# Patient Record
Sex: Male | Born: 1960 | Race: White | Hispanic: No | Marital: Single | Smoking: Never smoker
Health system: Southern US, Community
[De-identification: ages and names within clinical notes are randomized; demographics above are authoritative.]

## PROBLEM LIST (undated history)

## (undated) DIAGNOSIS — R32 Unspecified urinary incontinence: Secondary | ICD-10-CM

## (undated) DIAGNOSIS — I10 Essential (primary) hypertension: Secondary | ICD-10-CM

## (undated) DIAGNOSIS — R413 Other amnesia: Secondary | ICD-10-CM

## (undated) DIAGNOSIS — F419 Anxiety disorder, unspecified: Secondary | ICD-10-CM

## (undated) HISTORY — PX: TRANSTHORACIC ECHOCARDIOGRAM: SHX275

## (undated) HISTORY — PX: NO PAST SURGERIES: SHX2092

---

## 2015-04-20 ENCOUNTER — Other Ambulatory Visit (HOSPITAL_COMMUNITY): Payer: Self-pay | Admitting: General Practice

## 2015-04-20 DIAGNOSIS — R413 Other amnesia: Secondary | ICD-10-CM

## 2015-04-28 ENCOUNTER — Inpatient Hospital Stay (HOSPITAL_COMMUNITY)
Admission: RE | Admit: 2015-04-28 | Discharge: 2015-04-28 | Disposition: A | Discharge status: Home / Self Care | Payer: Self-pay | Source: Ambulatory Visit

## 2015-05-04 ENCOUNTER — Encounter (HOSPITAL_COMMUNITY): Payer: Self-pay | Admitting: *Deleted

## 2015-05-04 NOTE — Progress Notes (Signed)
LVM with Gladstone LighterAlecia, Radiology Scheduler, to make aware that pt is unable to arrive at 5:30 AM and is requesting a later appointment if available; waiting for a return call.

## 2015-05-04 NOTE — Progress Notes (Signed)
[facility] and she stated that pt was cancelled because they did not show for scheduled PAT. Marleta also stated that  Quinn PlowmanAmy Kearns, PA-C, needs to clarify order for consent,  "  with or without."

## 2015-05-05 ENCOUNTER — Ambulatory Visit (HOSPITAL_COMMUNITY): Admission: RE | Admit: 2015-05-05 | Payer: Medicaid Other | Source: Ambulatory Visit

## 2015-05-06 DIAGNOSIS — Z01818 Encounter for other preprocedural examination: Secondary | ICD-10-CM | POA: Diagnosis not present

## 2015-05-06 DIAGNOSIS — I10 Essential (primary) hypertension: Secondary | ICD-10-CM | POA: Diagnosis not present

## 2015-05-06 DIAGNOSIS — Z01812 Encounter for preprocedural laboratory examination: Secondary | ICD-10-CM | POA: Diagnosis not present

## 2015-05-06 DIAGNOSIS — R413 Other amnesia: Secondary | ICD-10-CM | POA: Diagnosis not present

## 2015-05-09 ENCOUNTER — Other Ambulatory Visit: Payer: Self-pay

## 2015-05-09 ENCOUNTER — Encounter (HOSPITAL_COMMUNITY)
Admission: RE | Admit: 2015-05-09 | Discharge: 2015-05-09 | Disposition: A | Discharge status: Home / Self Care | Payer: Medicaid Other | Source: Ambulatory Visit | Attending: General Practice | Admitting: General Practice

## 2015-05-09 ENCOUNTER — Encounter (HOSPITAL_COMMUNITY): Payer: Self-pay

## 2015-05-09 DIAGNOSIS — I10 Essential (primary) hypertension: Secondary | ICD-10-CM | POA: Diagnosis not present

## 2015-05-09 DIAGNOSIS — Z01818 Encounter for other preprocedural examination: Secondary | ICD-10-CM | POA: Diagnosis not present

## 2015-05-09 DIAGNOSIS — R413 Other amnesia: Secondary | ICD-10-CM | POA: Insufficient documentation

## 2015-05-09 DIAGNOSIS — Z01812 Encounter for preprocedural laboratory examination: Secondary | ICD-10-CM | POA: Diagnosis not present

## 2015-05-09 HISTORY — DX: Anxiety disorder, unspecified: F41.9

## 2015-05-09 HISTORY — DX: Essential (primary) hypertension: I10

## 2015-05-09 LAB — CBC
HCT: 36.9 % — ABNORMAL LOW (ref 39.0–52.0)
Hemoglobin: 13.1 g/dL (ref 13.0–17.0)
MCH: 33.8 pg (ref 26.0–34.0)
MCHC: 35.5 g/dL (ref 30.0–36.0)
MCV: 95.1 fL (ref 78.0–100.0)
PLATELETS: 138 10*3/uL — AB (ref 150–400)
RBC: 3.88 MIL/uL — ABNORMAL LOW (ref 4.22–5.81)
RDW: 12.4 % (ref 11.5–15.5)
WBC: 6.1 10*3/uL (ref 4.0–10.5)

## 2015-05-09 LAB — BASIC METABOLIC PANEL
Anion gap: 10 (ref 5–15)
BUN: 16 mg/dL (ref 6–20)
CALCIUM: 9.4 mg/dL (ref 8.9–10.3)
CO2: 27 mmol/L (ref 22–32)
CREATININE: 1.02 mg/dL (ref 0.61–1.24)
Chloride: 104 mmol/L (ref 101–111)
Glucose, Bld: 91 mg/dL (ref 65–99)
Potassium: 3.9 mmol/L (ref 3.5–5.1)
SODIUM: 141 mmol/L (ref 135–145)

## 2015-05-09 NOTE — Pre-Procedure Instructions (Signed)
Dylan CanaryRichard D Servantes  05/09/2015      [facility] [address] [place], [place] - [address] [place] [id number] Phone: [phone] Fax: [phone]    Your procedure is scheduled on     Tuesday   05/17/15   [facility] at 600 A.M.  Call this number if you have problems the morning of surgery:  [phone]   Remember:  Do not eat food or drink liquids after midnight.  Take these medicines the morning of surgery with A SIP OF WATER     LEVOTHYROXINE ,  METOPROLOL, QUETIAPINE (SEROQUEL)   Do not wear jewelry, make-up or nail polish.  Do not wear lotions, powders, or perfumes.  You may wear deodorant.  Do not shave 48 hours prior to surgery.  Men may shave face and neck.  Do not bring valuables to the hospital.  [facility] is not responsible for any belongings or valuables.  Contacts, dentures or bridgework may not be worn into surgery.  Leave your suitcase in the car.  After surgery it may be brought to your room.  For patients admitted to the hospital, discharge time will be determined by your treatment team.  Patients discharged the day of surgery will not be allowed to drive home.   Name and phone number of your driver:   SISTER Special instructions: [facility] - Preparing for Surgery  Before surgery, you can play an important role.  Because skin is not sterile, your skin needs to be as free of germs as possible.  You can reduce the number of germs on you skin by washing with CHG (chlorahexidine gluconate) soap before surgery.  CHG is an antiseptic cleaner which kills germs and bonds with the skin to continue killing germs even after washing.  Please DO NOT use if you have an allergy to CHG or antibacterial soaps.  If your skin becomes reddened/irritated stop using the CHG and inform your nurse when you arrive at Short Stay.  Do not shave (including legs and underarms) for at least 48 hours prior to the first CHG  shower.  You may shave your face.  Please follow these instructions carefully:   1.  Shower with CHG Soap the night before surgery and the                                morning of Surgery.  2.  If you choose to wash your hair, wash your hair first as usual with your       normal shampoo.  3.  After you shampoo, rinse your hair and body thoroughly to remove the                      Shampoo.  4.  Use CHG as you would any other liquid soap.  You can apply chg directly       to the skin and wash gently with scrungie or a clean washcloth.  5.  Apply the CHG Soap to your body ONLY FROM THE NECK DOWN.        Do not use on open wounds or open sores.  Avoid contact with your eyes,       ears, mouth and genitals (private parts).  Wash genitals (private parts)       with your normal soap.  6.  Wash thoroughly, paying special attention to  the area where your surgery        will be performed.  7.  Thoroughly rinse your body with warm water from the neck down.  8.  DO NOT shower/wash with your normal soap after using and rinsing off       the CHG Soap.  9.  Pat yourself dry with a clean towel.            10.  Wear clean pajamas.            11.  Place clean sheets on your bed the night of your first shower and do not        sleep with pets.  Day of Surgery  Do not apply any lotions/deoderants the morning of surgery.  Please wear clean clothes to the hospital/surgery center.    Please read over the following fact sheets that you were given. Pain Booklet, Coughing and Deep Breathing and Surgical Site Infection Prevention

## 2015-05-11 NOTE — Progress Notes (Addendum)
Anesthesia Chart Review: Patient is a 55 year old male scheduled for MRI of the brain without on 05/17/15 under anesthesia. MRI was initially unsuccessful with Ativan. MRI was ordered by Quinn PlowmanAmy Kearns, PA-C [facility], last visit 04/01/15 (see Care Everywhere). H&P form was completed by Nancy MarusValerie Lenhart, NP with [facility] in [place], [place] 254 867 2043([phone]) on 04/19/15 (scanned under Media tab).  History includes HTN, anxiety, memory loss, bowel and bladder incontinence. 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 as a young boy. No past surgeries. His sister Dayle PointsDebra Gibson brought him to PAT and is listed as the preferred contact number. He lives with Okey RegalCarol his significant other (Carol's brother is married to patient's sister).   Meds include levothyroxine, Lopressor, Seroquel.  PAT Vitals: BP 101/85, HR 85, RR 18, O2 sat 100%, T 37.1C.  05/09/15 EKG: NSR, LAD, possible inferior infarct. Currently, no comparison tracing available. Loss of low r wave in aVF, but overall probably not significantly changed when compared to [facility]11/[facility].  I have spoken with patient, Okey RegalCarol, and Dayle PointsDebra Gibson in hopes to obtain more information about his cardiac history. Patient says he was last seen by cardiology before the age of 55. He doesn't know specifically what type of "hole" he had, but he doesn't think his "hole" ever closed. He believes his previous PCP Dr. Allyson SabalJohn Woodyear [phone]) had him undergo a cardiac test (unknown specifics) [facility] within the past five years. Since [facility] did not send me an echo report as requested, Selena BattenKim at [facility] office contacted their medical records department and was able to obtain a 2013 echo (see below):  12/14/11 Echo [facility]; ordered by his former PCP Dr. Allyson SabalJohn Woodyear; indication VSD ?):  Study quality is fair.  LV chamber size is normal. There is no LVH. Global LV wall motion and contractility are WNL. There is normal LV systolic function. Estimated EF 50-55%. Abnormal LV diastolic dysfunction. Abnormal LV diastolic filling is observed, consistent with impaired relaxation. Normal LA/RA cavity size. RV cavity size is normal. There is aortic annular calcification. There is mitral annular calcification. Trace MR. Mild TR. The pulmonic valve is not well visualized. The aortic root is not well visualized.   Preoperative labs noted. BMET WNL. H/H 13.1/36.9. PLT 138K.   Above reviewed with anesthesiologist Dr. Renold DonGermeroth who also discussed echo findings with Dr. Maple HudsonMoser. If patient remains asymptomatic from a CV standpoint then it is anticipated that he can proceed as planned with MRI under anesthesia. I did call and speak with patient. He denied chest pain, SOB, edema, syncope, palpitations. He says he walks often without limitation from breathing or chest pain. I did advise that in the future he should talk with his current PCP about his cardiac history. She now has a copy of his 2013 echo as well and could hopefully direct him on whether or not he should get re-established or at least re-evaluated by a cardiologist at some point since the details are still vague.    Velna Ochsllison Benoit Meech, PA-C [facility]/Anesthesiology Phone [phone] 05/12/2015 1:07 PM

## 2015-05-12 ENCOUNTER — Encounter (HOSPITAL_COMMUNITY): Payer: Self-pay

## 2015-05-17 ENCOUNTER — Encounter (HOSPITAL_COMMUNITY): Payer: Self-pay | Admitting: *Deleted

## 2015-05-17 ENCOUNTER — Encounter (HOSPITAL_COMMUNITY)
Admission: RE | Disposition: A | Discharge status: Home / Self Care | Payer: Self-pay | Source: Ambulatory Visit | Attending: General Practice

## 2015-05-17 ENCOUNTER — Ambulatory Visit (HOSPITAL_COMMUNITY): Payer: Medicaid Other | Admitting: Vascular Surgery

## 2015-05-17 ENCOUNTER — Ambulatory Visit (HOSPITAL_COMMUNITY): Payer: Medicaid Other | Admitting: Anesthesiology

## 2015-05-17 ENCOUNTER — Ambulatory Visit (HOSPITAL_COMMUNITY)
Admission: RE | Admit: 2015-05-17 | Discharge: 2015-05-17 | Disposition: A | Discharge status: Home / Self Care | Payer: Medicaid Other | Source: Ambulatory Visit | Attending: General Practice | Admitting: General Practice

## 2015-05-17 DIAGNOSIS — I10 Essential (primary) hypertension: Secondary | ICD-10-CM | POA: Insufficient documentation

## 2015-05-17 DIAGNOSIS — R413 Other amnesia: Secondary | ICD-10-CM | POA: Insufficient documentation

## 2015-05-17 HISTORY — PX: RADIOLOGY WITH ANESTHESIA: SHX6223

## 2015-05-17 HISTORY — DX: Other amnesia: R41.3

## 2015-05-17 HISTORY — DX: Unspecified urinary incontinence: R32

## 2015-05-17 SURGERY — RADIOLOGY WITH ANESTHESIA
Anesthesia: General | Site: Head

## 2015-05-17 MED ORDER — MIDAZOLAM HCL 2 MG/2ML IJ SOLN
INTRAMUSCULAR | Status: AC
  Administered 2015-05-17: 2 mg
  Filled 2015-05-17: qty 2

## 2015-05-17 MED ORDER — MIDAZOLAM HCL 2 MG/2ML IJ SOLN
2.0000 mg | Freq: Once | INTRAMUSCULAR | Status: AC
  Administered 2015-05-17: 2 mg via INTRAVENOUS
  Filled 2015-05-17: qty 2

## 2015-05-17 MED ORDER — GADOBENATE DIMEGLUMINE 529 MG/ML IV SOLN
20.0000 mL | Freq: Once | INTRAVENOUS | Status: AC
  Administered 2015-05-17: 17 mL via INTRAVENOUS

## 2015-05-17 MED ORDER — MIDAZOLAM HCL 2 MG/2ML IJ SOLN: 2.0000 mg | Freq: Once | INTRAMUSCULAR | Status: DC

## 2015-05-17 MED ORDER — LACTATED RINGERS IV SOLN
INTRAVENOUS | Status: DC
  Administered 2015-05-17: 07:00:00 via INTRAVENOUS

## 2015-05-17 NOTE — Anesthesia Postprocedure Evaluation (Signed)
Anesthesia Post Note  Patient: Dylan Frye  Procedure(s) Performed: Procedure(s) (LRB): MRI OF BRAIN WITHOUT     (RADIOLOGY WITH ANESTHESIA) (N/A)  Patient location during evaluation: PACU Anesthesia Type: General Level of consciousness: awake and alert Pain management: pain level controlled Vital Signs Assessment: post-procedure vital signs reviewed and stable Respiratory status: spontaneous breathing, nonlabored ventilation, respiratory function stable and patient connected to nasal cannula oxygen Cardiovascular status: blood pressure returned to baseline and stable Postop Assessment: no signs of nausea or vomiting Anesthetic complications: no    Last Vitals:  Filed Vitals:   05/17/15 1018 05/17/15 1030  BP:  172/124  Pulse: 74 78  Temp:  36.3 C  Resp: 16 8    Last Pain: There were no vitals filed for this visit.               Phillips Groutarignan, Yittel Emrich

## 2015-05-17 NOTE — Progress Notes (Signed)
Peripheral IV removed from left wrist with catheter intact.

## 2015-05-17 NOTE — Progress Notes (Signed)
Restlessness noted pt has been to the bathroom 5 times and will not sit still. Continues to move about and pull at IV, even after wrapping IV with kerlex. Dr Acey Lavarignan called and new orders noted.

## 2015-05-17 NOTE — Progress Notes (Addendum)
Restlessness noted moving about in bed attempts to get out of bed. Versed repeated as ordered

## 2015-05-17 NOTE — Transfer of Care (Signed)
Immediate Anesthesia Transfer of Care Note  Patient: Dylan Frye  Procedure(s) Performed: Procedure(s): MRI OF BRAIN WITHOUT     (RADIOLOGY WITH ANESTHESIA) (N/A)  Patient Location: PACU  Anesthesia Type:General  Level of Consciousness: awake, alert  and pateint uncooperative  Airway & Oxygen Therapy: Patient Spontanous Breathing  Post-op Assessment: Report given to RN, Post -op Vital signs reviewed and stable and Patient moving all extremities  Post vital signs: Reviewed and stable  Last Vitals:  Filed Vitals:   05/17/15 0807 05/17/15 0813  BP:  175/109  Pulse: 74   Temp:    Resp:      Complications: No apparent anesthesia complications

## 2015-05-17 NOTE — Progress Notes (Signed)
Dr Acey Lavarignan in to see pt new orders ok to repeat Versed if needed

## 2015-05-17 NOTE — Anesthesia Preprocedure Evaluation (Addendum)
Anesthesia Evaluation  Patient identified by MRN, date of birth, ID band Patient awake  General Assessment Comment:Pt uncooperative, anxious.  Reviewed: Allergy & Precautions, NPO status , Patient's Chart, lab work & pertinent test results  Airway Mallampati: II  TM Distance: >3 FB Neck ROM: Full    Dental no notable dental hx.    Pulmonary neg pulmonary ROS,    Pulmonary exam normal breath sounds clear to auscultation       Cardiovascular hypertension, Pt. on medications negative cardio ROS Normal cardiovascular exam Rhythm:Regular Rate:Normal     Neuro/Psych negative neurological ROS  negative psych ROS   GI/Hepatic negative GI ROS, Neg liver ROS,   Endo/Other  negative endocrine ROS  Renal/GU negative Renal ROS  negative genitourinary   Musculoskeletal negative musculoskeletal ROS (+)   Abdominal   Peds negative pediatric ROS (+)  Hematology negative hematology ROS (+)   Anesthesia Other Findings   Reproductive/Obstetrics negative OB ROS                            Anesthesia Physical Anesthesia Plan  ASA: II  Anesthesia Plan: General   Post-op Pain Management:    Induction: Intravenous  Airway Management Planned: LMA  Additional Equipment:   Intra-op Plan:   Post-operative Plan: Extubation in OR  Informed Consent: I have reviewed the patients History and Physical, chart, labs and discussed the procedure including the risks, benefits and alternatives for the proposed anesthesia with the patient or authorized representative who has indicated his/her understanding and acceptance.   Dental advisory given  Plan Discussed with: CRNA  Anesthesia Plan Comments:         Anesthesia Quick Evaluation

## 2015-05-17 NOTE — Progress Notes (Signed)
Pt lying on stretcher, with eyes open, but remains still except moving his legs up and down.

## 2015-05-18 ENCOUNTER — Encounter (HOSPITAL_COMMUNITY): Payer: Self-pay | Admitting: Radiology

## 2015-05-19 MED FILL — Lactated Ringer's Solution: INTRAVENOUS | Qty: 1000 | Status: AC

## 2015-05-19 MED FILL — Propofol IV Emul 200 MG/20ML (10 MG/ML): INTRAVENOUS | Qty: 20 | Status: AC

## 2015-05-19 MED FILL — Sodium Chloride IV Soln 0.9%: INTRAVENOUS | Qty: 250 | Status: AC

## 2017-09-12 IMAGING — MR MR HEAD WO/W CM
11 of 14 series · 31 of 48 positions shown · IV contrast (multihance)
Comparison: None.

ADDENDUM:
Dictation error:

Brain section paragraph three should read "cavum velum interpositum
..."
CLINICAL DATA: Memory loss.  Sudden behavioral changes.
EXAM:
MRI HEAD WITHOUT AND WITH CONTRAST
TECHNIQUE: Multiplanar, multiecho pulse sequences of the brain and surrounding
structures were obtained without and with intravenous contrast.
CONTRAST:  17mL MULTIHANCE GADOBENATE DIMEGLUMINE 529 MG/ML IV SOLN

[Series 2: FLAIR · sagittal · 5.0mm · 0.47mm/px · 2 of 26 slices shown (1 of 2)]
[im 1/26]
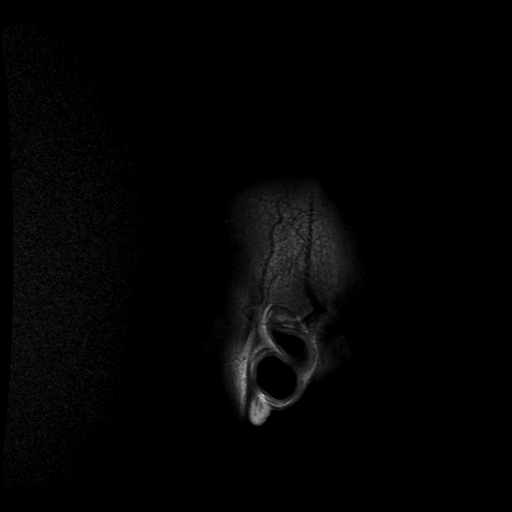
[im 26/26]
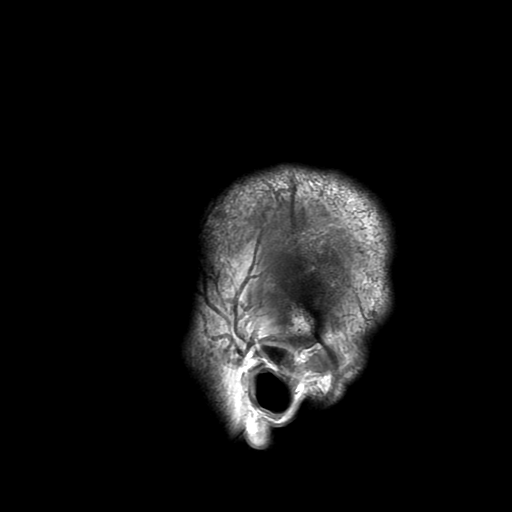

[Series 4: DWI · axial · 3.6mm · 0.94mm/px · z∈[-66,+85]mm · 7 of 86 slices shown (1 of 4)]
[im 1/86]
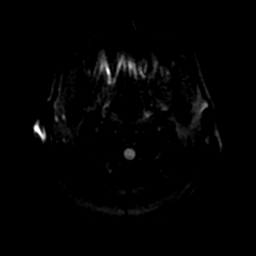
[im 15/86]
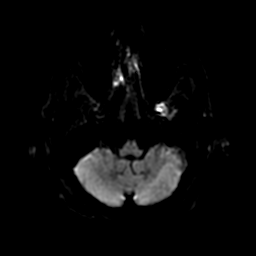
[im 29/86]
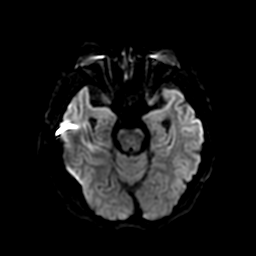
[im 43/86]
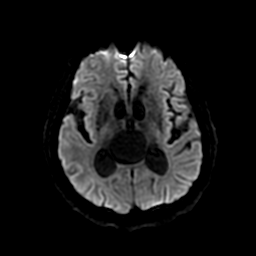
[im 57/86]
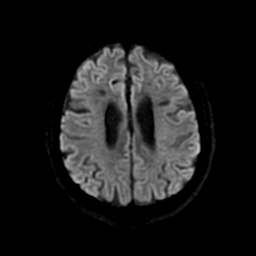
[im 71/86]
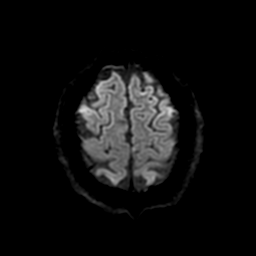
[im 86/86]
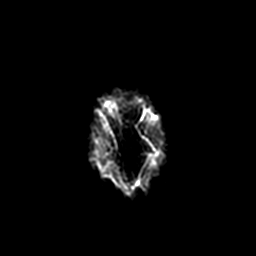

[Series 5: T2 · axial · 5.0mm · 0.47mm/px · z∈[-69,+81]mm · 2 of 26 slices shown]
[im 1/26]
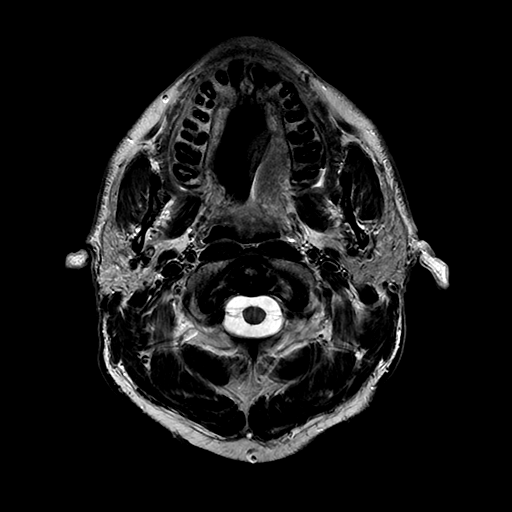
[im 26/26]
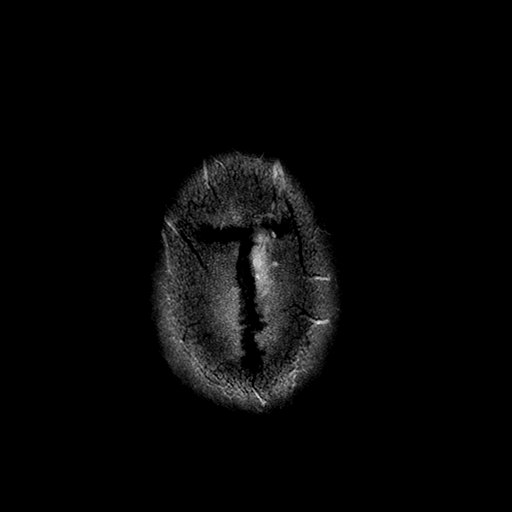

[Series 6: FLAIR · axial · 5.0mm · 0.47mm/px · z∈[-69,+81]mm · 2 of 26 slices shown (2 of 2)]
[im 1/26]
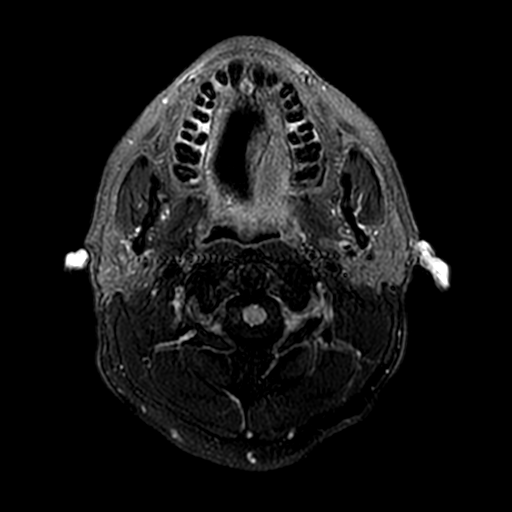
[im 26/26]
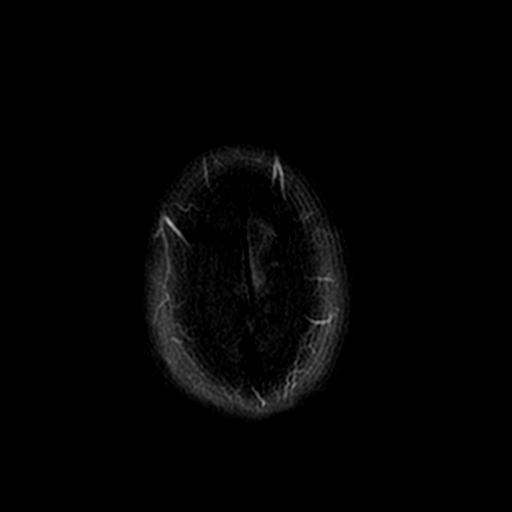

[Series 7: DWI · coronal · 5.0mm · 0.94mm/px · 5 of 68 slices shown (2 of 4)]
[im 1/68]
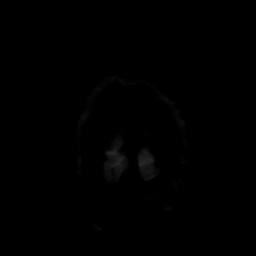
[im 17/68]
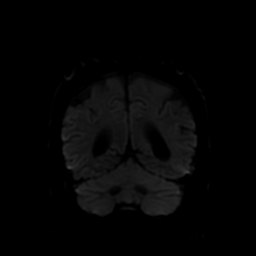
[im 34/68]
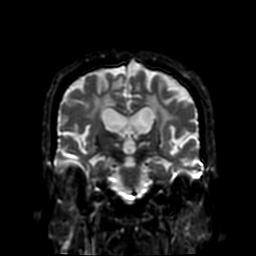
[im 51/68]
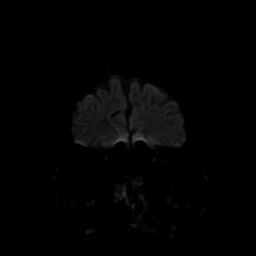
[im 68/68]
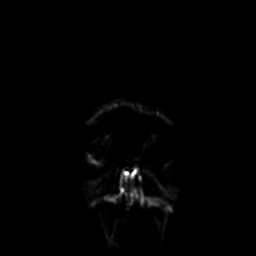

[Series 8: (person_name) · axial · 3.0mm · 0.47mm/px · 1 of 100 slices shown]
[im 1/100]
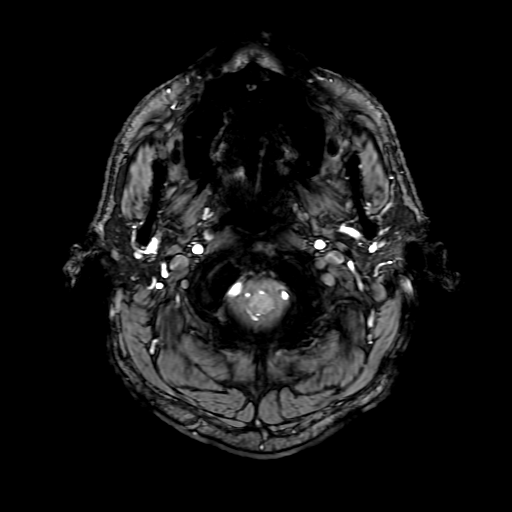

[Series 10: T2 post-contrast · coronal · 5.0mm · 0.39mm/px · 2 of 28 slices shown]
[im 1/28]
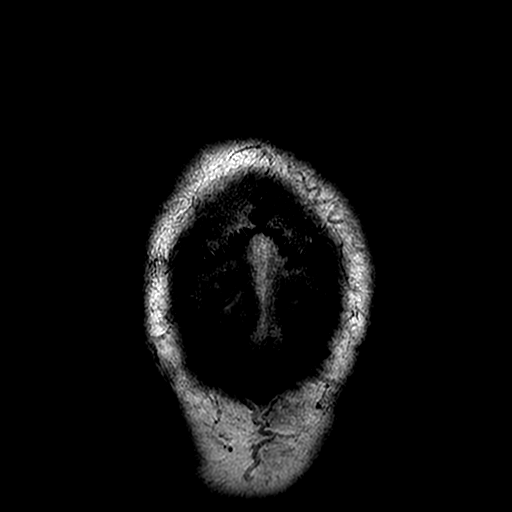
[im 28/28]
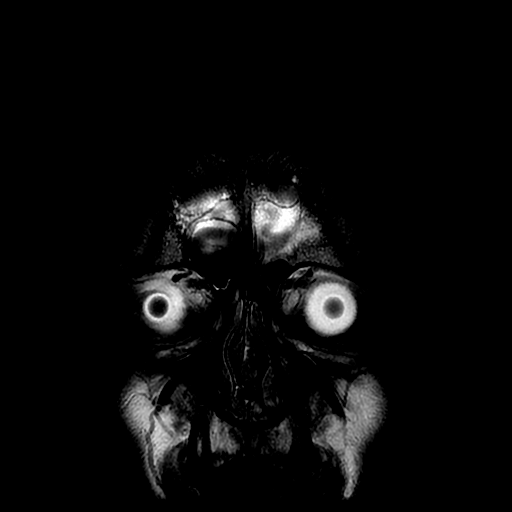

[Series 12: T1 · coronal · 5.0mm · 0.43mm/px · 2 of 28 slices shown (1 of 2)]
[im 1/28]
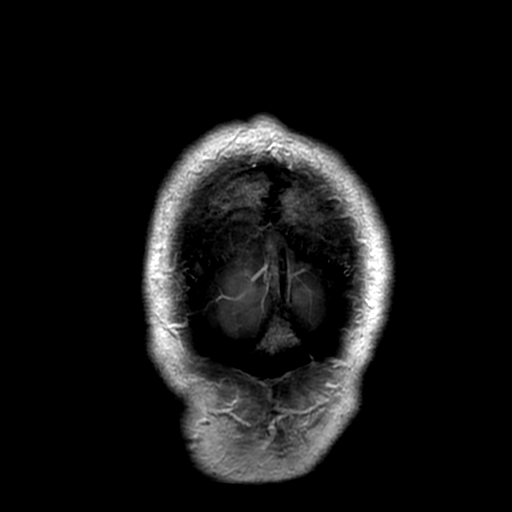
[im 28/28]
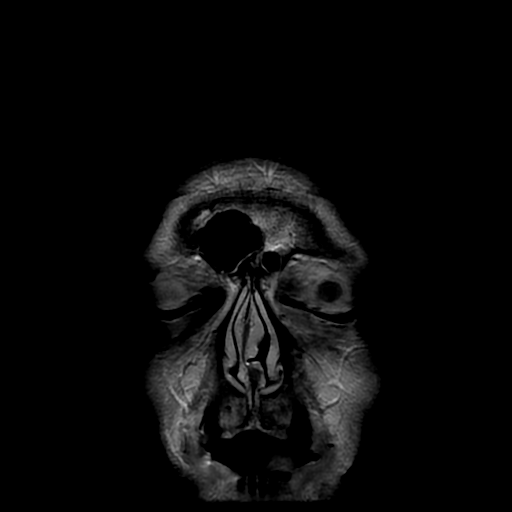

[Series 13: T1 · sagittal · 5.0mm · 0.47mm/px · 2 of 26 slices shown (2 of 2)]
[im 1/26]
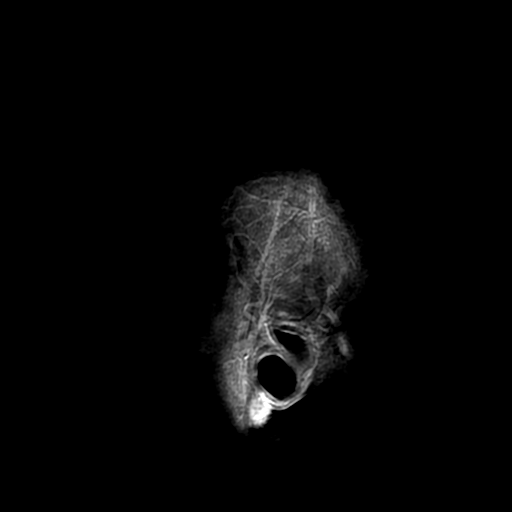
[im 26/26]
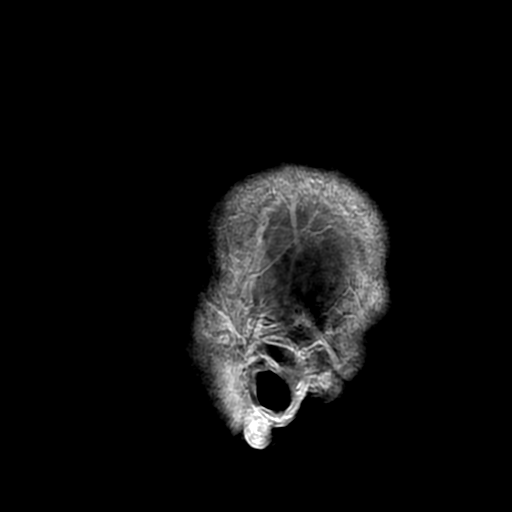

[Series 400: DWI · axial · 3.6mm · 0.94mm/px · z∈[-66,+85]mm · 3 of 43 slices shown (3 of 4)]
[im 1/43]
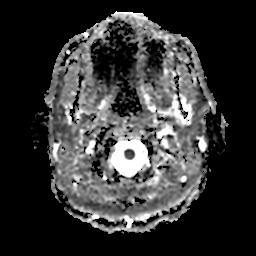
[im 22/43]
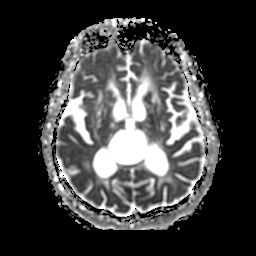
[im 43/43]
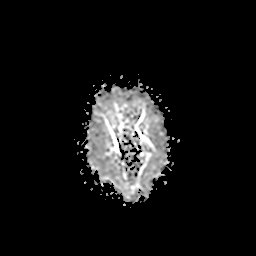

[Series 700: DWI · coronal · 5.0mm · 0.94mm/px · 3 of 34 slices shown (4 of 4)]
[im 1/34]
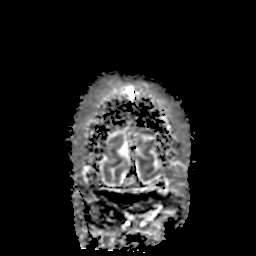
[im 17/34]
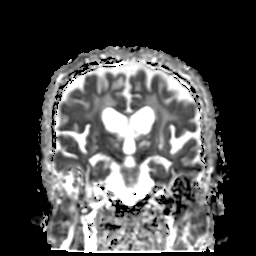
[im 34/34]
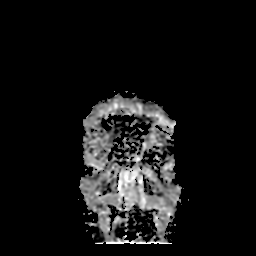

[31 of 48 positions shown; findings below may reference images not displayed]

FINDINGS: Calvarium and upper cervical spine: No focal marrow signal
abnormality.

Orbits: Distended optic nerve sheath complexes bilaterally without
mounding at thediscs.

Sinuses and Mastoids: Clear.

Brain: Linear right frontal subcortical white matter restricted
diffusion measuring 5 mm.

There is severe white matter disease with confluent hyperintensity
throughout cerebral hemispheres. There are multiple lacunes in the
bilateral centrum semiovale, pons, and deep gray nuclei - likely
combination of remote lacunar infarcts and dilated perivascular
spaces. There are numerous chronic hemorrhagic foci in the cerebral
cortex, brainstem, deep cerebellar hemispheres, and deep gray
nuclei. There is no visible enhancement or mass lesion associated
with these hemorrhagic foci. There is no historical indication of
immune compromise. This cystic changes seen at the temporal poles.

Incidental cavum the lung interpositum cyst measuring up to 32 mm.
No mass like intracranial enhancement.

Prominent leptomeningeal vessels on spin echo postcontrast imaging
is likely a field strength phenomenon. No leptomeningeal enhancement
seen on thin section gradient imaging.

Atrophy which accounts for ventriculomegaly.
IMPRESSION: 1. Severe white matter disease with multiple lacunes and chronic
hemorrhagic foci. Hypertensive encephalopathy or CADASIL could have
this appearance. Amyloid angiopathy is a considering given the
number of chronic cortical hemorrhages, but considered less likely
given the extensive subcortical involvement.
2. Tiny acute subcortical right frontal white matter infarct
incidentally detected.

## 2019-01-06 DEATH — deceased
